# Patient Record
Sex: Male | Born: 2000
Health system: Southern US, Community
[De-identification: ages and names within clinical notes are randomized; demographics above are authoritative.]

## PROBLEM LIST (undated history)

## (undated) DIAGNOSIS — J302 Other seasonal allergic rhinitis: Secondary | ICD-10-CM

---
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
## 2020-01-30 DIAGNOSIS — S83281A Other tear of lateral meniscus, current injury, right knee, initial encounter: Secondary | ICD-10-CM | POA: Diagnosis not present

## 2020-02-04 DIAGNOSIS — Z20828 Contact with and (suspected) exposure to other viral communicable diseases: Secondary | ICD-10-CM | POA: Diagnosis not present

## 2020-02-04 DIAGNOSIS — Z1159 Encounter for screening for other viral diseases: Secondary | ICD-10-CM | POA: Diagnosis not present

## 2020-02-17 DIAGNOSIS — Z20828 Contact with and (suspected) exposure to other viral communicable diseases: Secondary | ICD-10-CM | POA: Diagnosis not present

## 2020-02-17 DIAGNOSIS — Z1159 Encounter for screening for other viral diseases: Secondary | ICD-10-CM | POA: Diagnosis not present

## 2020-02-23 DIAGNOSIS — Z20828 Contact with and (suspected) exposure to other viral communicable diseases: Secondary | ICD-10-CM | POA: Diagnosis not present

## 2020-02-23 DIAGNOSIS — Z1159 Encounter for screening for other viral diseases: Secondary | ICD-10-CM | POA: Diagnosis not present

## 2020-02-24 DIAGNOSIS — S83251A Bucket-handle tear of lateral meniscus, current injury, right knee, initial encounter: Secondary | ICD-10-CM | POA: Diagnosis not present

## 2020-03-01 DIAGNOSIS — Z1159 Encounter for screening for other viral diseases: Secondary | ICD-10-CM | POA: Diagnosis not present

## 2020-03-01 DIAGNOSIS — Z20828 Contact with and (suspected) exposure to other viral communicable diseases: Secondary | ICD-10-CM | POA: Diagnosis not present

## 2020-03-08 DIAGNOSIS — Z1159 Encounter for screening for other viral diseases: Secondary | ICD-10-CM | POA: Diagnosis not present

## 2020-03-08 DIAGNOSIS — Z20828 Contact with and (suspected) exposure to other viral communicable diseases: Secondary | ICD-10-CM | POA: Diagnosis not present

## 2020-03-15 DIAGNOSIS — Z20828 Contact with and (suspected) exposure to other viral communicable diseases: Secondary | ICD-10-CM | POA: Diagnosis not present

## 2020-03-15 DIAGNOSIS — Z1159 Encounter for screening for other viral diseases: Secondary | ICD-10-CM | POA: Diagnosis not present

## 2020-03-15 DIAGNOSIS — S83251D Bucket-handle tear of lateral meniscus, current injury, right knee, subsequent encounter: Secondary | ICD-10-CM | POA: Diagnosis not present

## 2020-03-22 DIAGNOSIS — S83251D Bucket-handle tear of lateral meniscus, current injury, right knee, subsequent encounter: Secondary | ICD-10-CM | POA: Diagnosis not present

## 2020-03-22 DIAGNOSIS — Z20828 Contact with and (suspected) exposure to other viral communicable diseases: Secondary | ICD-10-CM | POA: Diagnosis not present

## 2020-03-22 DIAGNOSIS — Z1159 Encounter for screening for other viral diseases: Secondary | ICD-10-CM | POA: Diagnosis not present

## 2020-03-23 DIAGNOSIS — J029 Acute pharyngitis, unspecified: Secondary | ICD-10-CM | POA: Diagnosis not present

## 2020-03-23 DIAGNOSIS — R059 Cough, unspecified: Secondary | ICD-10-CM | POA: Diagnosis not present

## 2020-03-29 DIAGNOSIS — Z1159 Encounter for screening for other viral diseases: Secondary | ICD-10-CM | POA: Diagnosis not present

## 2020-03-29 DIAGNOSIS — Z20828 Contact with and (suspected) exposure to other viral communicable diseases: Secondary | ICD-10-CM | POA: Diagnosis not present

## 2020-04-05 DIAGNOSIS — Z20828 Contact with and (suspected) exposure to other viral communicable diseases: Secondary | ICD-10-CM | POA: Diagnosis not present

## 2020-04-05 DIAGNOSIS — Z1159 Encounter for screening for other viral diseases: Secondary | ICD-10-CM | POA: Diagnosis not present

## 2020-04-12 DIAGNOSIS — Z1159 Encounter for screening for other viral diseases: Secondary | ICD-10-CM | POA: Diagnosis not present

## 2020-04-12 DIAGNOSIS — Z20828 Contact with and (suspected) exposure to other viral communicable diseases: Secondary | ICD-10-CM | POA: Diagnosis not present

## 2020-04-13 DIAGNOSIS — S83251D Bucket-handle tear of lateral meniscus, current injury, right knee, subsequent encounter: Secondary | ICD-10-CM | POA: Diagnosis not present

## 2020-04-14 DIAGNOSIS — S83251D Bucket-handle tear of lateral meniscus, current injury, right knee, subsequent encounter: Secondary | ICD-10-CM | POA: Diagnosis not present

## 2020-04-14 DIAGNOSIS — S8991XA Unspecified injury of right lower leg, initial encounter: Secondary | ICD-10-CM | POA: Diagnosis not present

## 2020-04-14 DIAGNOSIS — M25561 Pain in right knee: Secondary | ICD-10-CM | POA: Diagnosis not present

## 2020-04-15 DIAGNOSIS — S83251D Bucket-handle tear of lateral meniscus, current injury, right knee, subsequent encounter: Secondary | ICD-10-CM | POA: Diagnosis not present

## 2020-04-16 DIAGNOSIS — S83511A Sprain of anterior cruciate ligament of right knee, initial encounter: Secondary | ICD-10-CM | POA: Diagnosis not present

## 2020-04-16 DIAGNOSIS — S83251D Bucket-handle tear of lateral meniscus, current injury, right knee, subsequent encounter: Secondary | ICD-10-CM | POA: Diagnosis not present

## 2020-04-19 DIAGNOSIS — S83511D Sprain of anterior cruciate ligament of right knee, subsequent encounter: Secondary | ICD-10-CM | POA: Diagnosis not present

## 2020-04-19 DIAGNOSIS — Z1159 Encounter for screening for other viral diseases: Secondary | ICD-10-CM | POA: Diagnosis not present

## 2020-04-19 DIAGNOSIS — S83251D Bucket-handle tear of lateral meniscus, current injury, right knee, subsequent encounter: Secondary | ICD-10-CM | POA: Diagnosis not present

## 2020-04-19 DIAGNOSIS — Z20828 Contact with and (suspected) exposure to other viral communicable diseases: Secondary | ICD-10-CM | POA: Diagnosis not present

## 2020-04-20 DIAGNOSIS — S83251D Bucket-handle tear of lateral meniscus, current injury, right knee, subsequent encounter: Secondary | ICD-10-CM | POA: Diagnosis not present

## 2020-04-21 DIAGNOSIS — S83251D Bucket-handle tear of lateral meniscus, current injury, right knee, subsequent encounter: Secondary | ICD-10-CM | POA: Diagnosis not present

## 2020-04-23 DIAGNOSIS — S83241A Other tear of medial meniscus, current injury, right knee, initial encounter: Secondary | ICD-10-CM | POA: Diagnosis not present

## 2020-04-23 DIAGNOSIS — S83511A Sprain of anterior cruciate ligament of right knee, initial encounter: Secondary | ICD-10-CM | POA: Diagnosis not present

## 2020-04-26 DIAGNOSIS — S83251D Bucket-handle tear of lateral meniscus, current injury, right knee, subsequent encounter: Secondary | ICD-10-CM | POA: Diagnosis not present

## 2020-04-27 DIAGNOSIS — S83251D Bucket-handle tear of lateral meniscus, current injury, right knee, subsequent encounter: Secondary | ICD-10-CM | POA: Diagnosis not present

## 2020-04-28 DIAGNOSIS — S83251D Bucket-handle tear of lateral meniscus, current injury, right knee, subsequent encounter: Secondary | ICD-10-CM | POA: Diagnosis not present

## 2020-05-18 DIAGNOSIS — S83231A Complex tear of medial meniscus, current injury, right knee, initial encounter: Secondary | ICD-10-CM | POA: Diagnosis not present

## 2020-05-18 DIAGNOSIS — S83281A Other tear of lateral meniscus, current injury, right knee, initial encounter: Secondary | ICD-10-CM | POA: Diagnosis not present

## 2020-05-18 DIAGNOSIS — S83511A Sprain of anterior cruciate ligament of right knee, initial encounter: Secondary | ICD-10-CM | POA: Diagnosis not present

## 2020-05-18 DIAGNOSIS — Y999 Unspecified external cause status: Secondary | ICD-10-CM | POA: Diagnosis not present

## 2020-05-18 DIAGNOSIS — G8918 Other acute postprocedural pain: Secondary | ICD-10-CM | POA: Diagnosis not present

## 2020-05-18 DIAGNOSIS — X58XXXA Exposure to other specified factors, initial encounter: Secondary | ICD-10-CM | POA: Diagnosis not present

## 2020-05-21 DIAGNOSIS — M25561 Pain in right knee: Secondary | ICD-10-CM | POA: Diagnosis not present

## 2020-05-24 DIAGNOSIS — M25561 Pain in right knee: Secondary | ICD-10-CM | POA: Diagnosis not present

## 2020-05-25 DIAGNOSIS — M25561 Pain in right knee: Secondary | ICD-10-CM | POA: Diagnosis not present

## 2020-05-27 DIAGNOSIS — M25561 Pain in right knee: Secondary | ICD-10-CM | POA: Diagnosis not present

## 2020-05-31 DIAGNOSIS — Z4789 Encounter for other orthopedic aftercare: Secondary | ICD-10-CM | POA: Diagnosis not present

## 2020-05-31 DIAGNOSIS — M25561 Pain in right knee: Secondary | ICD-10-CM | POA: Diagnosis not present

## 2020-06-02 DIAGNOSIS — M25561 Pain in right knee: Secondary | ICD-10-CM | POA: Diagnosis not present

## 2020-06-03 DIAGNOSIS — M25561 Pain in right knee: Secondary | ICD-10-CM | POA: Diagnosis not present

## 2020-06-10 DIAGNOSIS — M25561 Pain in right knee: Secondary | ICD-10-CM | POA: Diagnosis not present

## 2020-06-14 DIAGNOSIS — M25561 Pain in right knee: Secondary | ICD-10-CM | POA: Diagnosis not present

## 2020-06-17 DIAGNOSIS — M25561 Pain in right knee: Secondary | ICD-10-CM | POA: Diagnosis not present

## 2020-06-21 DIAGNOSIS — M25561 Pain in right knee: Secondary | ICD-10-CM | POA: Diagnosis not present

## 2020-06-21 DIAGNOSIS — Z4789 Encounter for other orthopedic aftercare: Secondary | ICD-10-CM | POA: Diagnosis not present

## 2020-06-25 ENCOUNTER — Other Ambulatory Visit: Payer: BC Managed Care – PPO

## 2020-06-25 DIAGNOSIS — Z20822 Contact with and (suspected) exposure to covid-19: Secondary | ICD-10-CM | POA: Diagnosis not present

## 2020-06-28 DIAGNOSIS — Z1159 Encounter for screening for other viral diseases: Secondary | ICD-10-CM | POA: Diagnosis not present

## 2020-06-28 DIAGNOSIS — Z20828 Contact with and (suspected) exposure to other viral communicable diseases: Secondary | ICD-10-CM | POA: Diagnosis not present

## 2020-06-28 LAB — NOVEL CORONAVIRUS, NAA: SARS-CoV-2, NAA: NOT DETECTED

## 2020-06-29 DIAGNOSIS — S83511D Sprain of anterior cruciate ligament of right knee, subsequent encounter: Secondary | ICD-10-CM | POA: Diagnosis not present

## 2020-06-29 DIAGNOSIS — S83251D Bucket-handle tear of lateral meniscus, current injury, right knee, subsequent encounter: Secondary | ICD-10-CM | POA: Diagnosis not present

## 2020-06-30 DIAGNOSIS — S83251D Bucket-handle tear of lateral meniscus, current injury, right knee, subsequent encounter: Secondary | ICD-10-CM | POA: Diagnosis not present

## 2020-06-30 DIAGNOSIS — S83511D Sprain of anterior cruciate ligament of right knee, subsequent encounter: Secondary | ICD-10-CM | POA: Diagnosis not present

## 2020-07-01 DIAGNOSIS — S83511D Sprain of anterior cruciate ligament of right knee, subsequent encounter: Secondary | ICD-10-CM | POA: Diagnosis not present

## 2020-07-01 DIAGNOSIS — S83251D Bucket-handle tear of lateral meniscus, current injury, right knee, subsequent encounter: Secondary | ICD-10-CM | POA: Diagnosis not present

## 2020-07-05 DIAGNOSIS — S83511D Sprain of anterior cruciate ligament of right knee, subsequent encounter: Secondary | ICD-10-CM | POA: Diagnosis not present

## 2020-07-05 DIAGNOSIS — S83251D Bucket-handle tear of lateral meniscus, current injury, right knee, subsequent encounter: Secondary | ICD-10-CM | POA: Diagnosis not present

## 2020-07-06 DIAGNOSIS — S83511D Sprain of anterior cruciate ligament of right knee, subsequent encounter: Secondary | ICD-10-CM | POA: Diagnosis not present

## 2020-07-06 DIAGNOSIS — S83251D Bucket-handle tear of lateral meniscus, current injury, right knee, subsequent encounter: Secondary | ICD-10-CM | POA: Diagnosis not present

## 2020-07-07 DIAGNOSIS — S83251D Bucket-handle tear of lateral meniscus, current injury, right knee, subsequent encounter: Secondary | ICD-10-CM | POA: Diagnosis not present

## 2020-07-08 DIAGNOSIS — S83511D Sprain of anterior cruciate ligament of right knee, subsequent encounter: Secondary | ICD-10-CM | POA: Diagnosis not present

## 2020-07-08 DIAGNOSIS — S83251D Bucket-handle tear of lateral meniscus, current injury, right knee, subsequent encounter: Secondary | ICD-10-CM | POA: Diagnosis not present

## 2020-07-09 DIAGNOSIS — S83251D Bucket-handle tear of lateral meniscus, current injury, right knee, subsequent encounter: Secondary | ICD-10-CM | POA: Diagnosis not present

## 2020-07-09 DIAGNOSIS — S83511D Sprain of anterior cruciate ligament of right knee, subsequent encounter: Secondary | ICD-10-CM | POA: Diagnosis not present

## 2020-07-12 DIAGNOSIS — S83511D Sprain of anterior cruciate ligament of right knee, subsequent encounter: Secondary | ICD-10-CM | POA: Diagnosis not present

## 2020-07-12 DIAGNOSIS — S83251D Bucket-handle tear of lateral meniscus, current injury, right knee, subsequent encounter: Secondary | ICD-10-CM | POA: Diagnosis not present

## 2020-07-13 DIAGNOSIS — S83511D Sprain of anterior cruciate ligament of right knee, subsequent encounter: Secondary | ICD-10-CM | POA: Diagnosis not present

## 2020-07-13 DIAGNOSIS — S83251D Bucket-handle tear of lateral meniscus, current injury, right knee, subsequent encounter: Secondary | ICD-10-CM | POA: Diagnosis not present

## 2020-07-14 DIAGNOSIS — S83511D Sprain of anterior cruciate ligament of right knee, subsequent encounter: Secondary | ICD-10-CM | POA: Diagnosis not present

## 2020-07-14 DIAGNOSIS — S83251D Bucket-handle tear of lateral meniscus, current injury, right knee, subsequent encounter: Secondary | ICD-10-CM | POA: Diagnosis not present

## 2020-07-15 DIAGNOSIS — S83511D Sprain of anterior cruciate ligament of right knee, subsequent encounter: Secondary | ICD-10-CM | POA: Diagnosis not present

## 2020-07-15 DIAGNOSIS — S83251D Bucket-handle tear of lateral meniscus, current injury, right knee, subsequent encounter: Secondary | ICD-10-CM | POA: Diagnosis not present

## 2020-07-19 DIAGNOSIS — S83251D Bucket-handle tear of lateral meniscus, current injury, right knee, subsequent encounter: Secondary | ICD-10-CM | POA: Diagnosis not present

## 2020-07-19 DIAGNOSIS — S83511D Sprain of anterior cruciate ligament of right knee, subsequent encounter: Secondary | ICD-10-CM | POA: Diagnosis not present

## 2020-07-20 DIAGNOSIS — S83251D Bucket-handle tear of lateral meniscus, current injury, right knee, subsequent encounter: Secondary | ICD-10-CM | POA: Diagnosis not present

## 2020-07-20 DIAGNOSIS — S83511D Sprain of anterior cruciate ligament of right knee, subsequent encounter: Secondary | ICD-10-CM | POA: Diagnosis not present

## 2020-07-22 DIAGNOSIS — S83511D Sprain of anterior cruciate ligament of right knee, subsequent encounter: Secondary | ICD-10-CM | POA: Diagnosis not present

## 2020-07-22 DIAGNOSIS — S83251D Bucket-handle tear of lateral meniscus, current injury, right knee, subsequent encounter: Secondary | ICD-10-CM | POA: Diagnosis not present

## 2020-07-23 DIAGNOSIS — S83251D Bucket-handle tear of lateral meniscus, current injury, right knee, subsequent encounter: Secondary | ICD-10-CM | POA: Diagnosis not present

## 2020-07-23 DIAGNOSIS — S83511D Sprain of anterior cruciate ligament of right knee, subsequent encounter: Secondary | ICD-10-CM | POA: Diagnosis not present

## 2020-07-26 DIAGNOSIS — S83251D Bucket-handle tear of lateral meniscus, current injury, right knee, subsequent encounter: Secondary | ICD-10-CM | POA: Diagnosis not present

## 2020-07-26 DIAGNOSIS — S83511D Sprain of anterior cruciate ligament of right knee, subsequent encounter: Secondary | ICD-10-CM | POA: Diagnosis not present

## 2020-07-27 DIAGNOSIS — S83251D Bucket-handle tear of lateral meniscus, current injury, right knee, subsequent encounter: Secondary | ICD-10-CM | POA: Diagnosis not present

## 2020-07-27 DIAGNOSIS — S83511D Sprain of anterior cruciate ligament of right knee, subsequent encounter: Secondary | ICD-10-CM | POA: Diagnosis not present

## 2020-07-28 DIAGNOSIS — S83251D Bucket-handle tear of lateral meniscus, current injury, right knee, subsequent encounter: Secondary | ICD-10-CM | POA: Diagnosis not present

## 2020-07-28 DIAGNOSIS — S83511D Sprain of anterior cruciate ligament of right knee, subsequent encounter: Secondary | ICD-10-CM | POA: Diagnosis not present

## 2020-08-02 DIAGNOSIS — S83251D Bucket-handle tear of lateral meniscus, current injury, right knee, subsequent encounter: Secondary | ICD-10-CM | POA: Diagnosis not present

## 2020-08-03 DIAGNOSIS — S83251D Bucket-handle tear of lateral meniscus, current injury, right knee, subsequent encounter: Secondary | ICD-10-CM | POA: Diagnosis not present

## 2020-08-03 DIAGNOSIS — S83511D Sprain of anterior cruciate ligament of right knee, subsequent encounter: Secondary | ICD-10-CM | POA: Diagnosis not present

## 2020-08-04 DIAGNOSIS — S83251D Bucket-handle tear of lateral meniscus, current injury, right knee, subsequent encounter: Secondary | ICD-10-CM | POA: Diagnosis not present

## 2020-08-05 DIAGNOSIS — S83251D Bucket-handle tear of lateral meniscus, current injury, right knee, subsequent encounter: Secondary | ICD-10-CM | POA: Diagnosis not present

## 2020-08-05 DIAGNOSIS — S83511D Sprain of anterior cruciate ligament of right knee, subsequent encounter: Secondary | ICD-10-CM | POA: Diagnosis not present

## 2020-08-06 DIAGNOSIS — S83511D Sprain of anterior cruciate ligament of right knee, subsequent encounter: Secondary | ICD-10-CM | POA: Diagnosis not present

## 2020-08-06 DIAGNOSIS — S83251D Bucket-handle tear of lateral meniscus, current injury, right knee, subsequent encounter: Secondary | ICD-10-CM | POA: Diagnosis not present

## 2020-08-09 DIAGNOSIS — S83251D Bucket-handle tear of lateral meniscus, current injury, right knee, subsequent encounter: Secondary | ICD-10-CM | POA: Diagnosis not present

## 2020-08-09 DIAGNOSIS — S83511D Sprain of anterior cruciate ligament of right knee, subsequent encounter: Secondary | ICD-10-CM | POA: Diagnosis not present

## 2020-08-10 DIAGNOSIS — S83511D Sprain of anterior cruciate ligament of right knee, subsequent encounter: Secondary | ICD-10-CM | POA: Diagnosis not present

## 2020-08-10 DIAGNOSIS — S83251D Bucket-handle tear of lateral meniscus, current injury, right knee, subsequent encounter: Secondary | ICD-10-CM | POA: Diagnosis not present

## 2020-08-11 DIAGNOSIS — S83251D Bucket-handle tear of lateral meniscus, current injury, right knee, subsequent encounter: Secondary | ICD-10-CM | POA: Diagnosis not present

## 2020-08-11 DIAGNOSIS — S83511D Sprain of anterior cruciate ligament of right knee, subsequent encounter: Secondary | ICD-10-CM | POA: Diagnosis not present

## 2020-08-12 DIAGNOSIS — S83511D Sprain of anterior cruciate ligament of right knee, subsequent encounter: Secondary | ICD-10-CM | POA: Diagnosis not present

## 2020-08-16 DIAGNOSIS — S83511D Sprain of anterior cruciate ligament of right knee, subsequent encounter: Secondary | ICD-10-CM | POA: Diagnosis not present

## 2020-08-17 DIAGNOSIS — S83511D Sprain of anterior cruciate ligament of right knee, subsequent encounter: Secondary | ICD-10-CM | POA: Diagnosis not present

## 2020-08-18 DIAGNOSIS — S83511D Sprain of anterior cruciate ligament of right knee, subsequent encounter: Secondary | ICD-10-CM | POA: Diagnosis not present

## 2020-08-19 DIAGNOSIS — S83511D Sprain of anterior cruciate ligament of right knee, subsequent encounter: Secondary | ICD-10-CM | POA: Diagnosis not present

## 2020-08-30 DIAGNOSIS — S83511D Sprain of anterior cruciate ligament of right knee, subsequent encounter: Secondary | ICD-10-CM | POA: Diagnosis not present

## 2020-08-31 DIAGNOSIS — R0981 Nasal congestion: Secondary | ICD-10-CM | POA: Diagnosis not present

## 2020-08-31 DIAGNOSIS — M791 Myalgia, unspecified site: Secondary | ICD-10-CM | POA: Diagnosis not present

## 2020-08-31 DIAGNOSIS — R509 Fever, unspecified: Secondary | ICD-10-CM | POA: Diagnosis not present

## 2020-08-31 DIAGNOSIS — R070 Pain in throat: Secondary | ICD-10-CM | POA: Diagnosis not present

## 2020-09-01 DIAGNOSIS — S83511D Sprain of anterior cruciate ligament of right knee, subsequent encounter: Secondary | ICD-10-CM | POA: Diagnosis not present

## 2020-09-02 DIAGNOSIS — S83511D Sprain of anterior cruciate ligament of right knee, subsequent encounter: Secondary | ICD-10-CM | POA: Diagnosis not present

## 2020-09-03 DIAGNOSIS — S83511D Sprain of anterior cruciate ligament of right knee, subsequent encounter: Secondary | ICD-10-CM | POA: Diagnosis not present

## 2020-09-05 DIAGNOSIS — H6501 Acute serous otitis media, right ear: Secondary | ICD-10-CM | POA: Diagnosis not present

## 2020-09-06 DIAGNOSIS — S83511D Sprain of anterior cruciate ligament of right knee, subsequent encounter: Secondary | ICD-10-CM | POA: Diagnosis not present

## 2020-09-07 DIAGNOSIS — S83511D Sprain of anterior cruciate ligament of right knee, subsequent encounter: Secondary | ICD-10-CM | POA: Diagnosis not present

## 2020-09-08 DIAGNOSIS — S83511D Sprain of anterior cruciate ligament of right knee, subsequent encounter: Secondary | ICD-10-CM | POA: Diagnosis not present

## 2020-09-09 DIAGNOSIS — S83511D Sprain of anterior cruciate ligament of right knee, subsequent encounter: Secondary | ICD-10-CM | POA: Diagnosis not present

## 2020-09-13 DIAGNOSIS — S83511D Sprain of anterior cruciate ligament of right knee, subsequent encounter: Secondary | ICD-10-CM | POA: Diagnosis not present

## 2020-09-14 DIAGNOSIS — S83511D Sprain of anterior cruciate ligament of right knee, subsequent encounter: Secondary | ICD-10-CM | POA: Diagnosis not present

## 2020-09-16 DIAGNOSIS — S83511D Sprain of anterior cruciate ligament of right knee, subsequent encounter: Secondary | ICD-10-CM | POA: Diagnosis not present

## 2020-09-20 DIAGNOSIS — S83511D Sprain of anterior cruciate ligament of right knee, subsequent encounter: Secondary | ICD-10-CM | POA: Diagnosis not present

## 2020-09-22 DIAGNOSIS — S83511D Sprain of anterior cruciate ligament of right knee, subsequent encounter: Secondary | ICD-10-CM | POA: Diagnosis not present

## 2020-09-27 DIAGNOSIS — S83511D Sprain of anterior cruciate ligament of right knee, subsequent encounter: Secondary | ICD-10-CM | POA: Diagnosis not present

## 2020-09-28 DIAGNOSIS — S83511D Sprain of anterior cruciate ligament of right knee, subsequent encounter: Secondary | ICD-10-CM | POA: Diagnosis not present

## 2020-09-29 DIAGNOSIS — S83511D Sprain of anterior cruciate ligament of right knee, subsequent encounter: Secondary | ICD-10-CM | POA: Diagnosis not present

## 2020-10-04 DIAGNOSIS — S83511D Sprain of anterior cruciate ligament of right knee, subsequent encounter: Secondary | ICD-10-CM | POA: Diagnosis not present

## 2020-10-05 DIAGNOSIS — S83511D Sprain of anterior cruciate ligament of right knee, subsequent encounter: Secondary | ICD-10-CM | POA: Diagnosis not present

## 2020-10-08 DIAGNOSIS — Z0000 Encounter for general adult medical examination without abnormal findings: Secondary | ICD-10-CM | POA: Diagnosis not present

## 2020-10-11 DIAGNOSIS — S83511D Sprain of anterior cruciate ligament of right knee, subsequent encounter: Secondary | ICD-10-CM | POA: Diagnosis not present

## 2020-10-12 DIAGNOSIS — S83511D Sprain of anterior cruciate ligament of right knee, subsequent encounter: Secondary | ICD-10-CM | POA: Diagnosis not present

## 2020-10-14 DIAGNOSIS — S83511D Sprain of anterior cruciate ligament of right knee, subsequent encounter: Secondary | ICD-10-CM | POA: Diagnosis not present

## 2020-10-15 DIAGNOSIS — S83511D Sprain of anterior cruciate ligament of right knee, subsequent encounter: Secondary | ICD-10-CM | POA: Diagnosis not present

## 2020-11-02 DIAGNOSIS — Z23 Encounter for immunization: Secondary | ICD-10-CM | POA: Diagnosis not present

## 2020-11-15 DIAGNOSIS — Z4789 Encounter for other orthopedic aftercare: Secondary | ICD-10-CM | POA: Diagnosis not present

## 2020-11-19 DIAGNOSIS — N50819 Testicular pain, unspecified: Secondary | ICD-10-CM | POA: Diagnosis not present

## 2020-11-19 DIAGNOSIS — N492 Inflammatory disorders of scrotum: Secondary | ICD-10-CM | POA: Diagnosis not present

## 2020-11-25 ENCOUNTER — Other Ambulatory Visit: Payer: Self-pay | Admitting: Family Medicine

## 2020-11-25 DIAGNOSIS — N50819 Testicular pain, unspecified: Secondary | ICD-10-CM

## 2020-12-01 DIAGNOSIS — Z4789 Encounter for other orthopedic aftercare: Secondary | ICD-10-CM | POA: Diagnosis not present

## 2020-12-07 ENCOUNTER — Ambulatory Visit
Admission: RE | Admit: 2020-12-07 | Discharge: 2020-12-07 | Disposition: A | Discharge status: Home / Self Care | Payer: BC Managed Care – PPO | Source: Ambulatory Visit | Attending: Family Medicine | Admitting: Family Medicine

## 2020-12-07 DIAGNOSIS — I861 Scrotal varices: Secondary | ICD-10-CM | POA: Diagnosis not present

## 2020-12-07 DIAGNOSIS — K4090 Unilateral inguinal hernia, without obstruction or gangrene, not specified as recurrent: Secondary | ICD-10-CM | POA: Diagnosis not present

## 2020-12-07 DIAGNOSIS — N50819 Testicular pain, unspecified: Secondary | ICD-10-CM

## 2020-12-08 DIAGNOSIS — M25561 Pain in right knee: Secondary | ICD-10-CM | POA: Diagnosis not present

## 2020-12-15 DIAGNOSIS — H6122 Impacted cerumen, left ear: Secondary | ICD-10-CM | POA: Diagnosis not present

## 2020-12-16 DIAGNOSIS — M25561 Pain in right knee: Secondary | ICD-10-CM | POA: Diagnosis not present

## 2020-12-20 DIAGNOSIS — M25561 Pain in right knee: Secondary | ICD-10-CM | POA: Diagnosis not present

## 2020-12-24 DIAGNOSIS — M25561 Pain in right knee: Secondary | ICD-10-CM | POA: Diagnosis not present

## 2020-12-29 DIAGNOSIS — M25561 Pain in right knee: Secondary | ICD-10-CM | POA: Diagnosis not present

## 2021-01-06 DIAGNOSIS — M25561 Pain in right knee: Secondary | ICD-10-CM | POA: Diagnosis not present

## 2021-01-07 DIAGNOSIS — K4090 Unilateral inguinal hernia, without obstruction or gangrene, not specified as recurrent: Secondary | ICD-10-CM | POA: Diagnosis not present

## 2021-01-07 DIAGNOSIS — L0591 Pilonidal cyst without abscess: Secondary | ICD-10-CM | POA: Diagnosis not present

## 2021-01-11 DIAGNOSIS — M25561 Pain in right knee: Secondary | ICD-10-CM | POA: Diagnosis not present

## 2021-01-21 DIAGNOSIS — M25561 Pain in right knee: Secondary | ICD-10-CM | POA: Diagnosis not present

## 2021-01-28 DIAGNOSIS — M25561 Pain in right knee: Secondary | ICD-10-CM | POA: Diagnosis not present

## 2021-01-31 DIAGNOSIS — M25561 Pain in right knee: Secondary | ICD-10-CM | POA: Diagnosis not present

## 2021-02-09 DIAGNOSIS — M25561 Pain in right knee: Secondary | ICD-10-CM | POA: Diagnosis not present

## 2021-02-10 DIAGNOSIS — K4020 Bilateral inguinal hernia, without obstruction or gangrene, not specified as recurrent: Secondary | ICD-10-CM | POA: Diagnosis not present

## 2021-02-10 DIAGNOSIS — L0591 Pilonidal cyst without abscess: Secondary | ICD-10-CM | POA: Diagnosis not present

## 2021-03-09 DIAGNOSIS — K4020 Bilateral inguinal hernia, without obstruction or gangrene, not specified as recurrent: Secondary | ICD-10-CM | POA: Diagnosis not present

## 2021-03-11 DIAGNOSIS — D176 Benign lipomatous neoplasm of spermatic cord: Secondary | ICD-10-CM | POA: Diagnosis not present

## 2021-03-11 DIAGNOSIS — K4020 Bilateral inguinal hernia, without obstruction or gangrene, not specified as recurrent: Secondary | ICD-10-CM | POA: Diagnosis not present

## 2021-03-11 DIAGNOSIS — N50812 Left testicular pain: Secondary | ICD-10-CM | POA: Diagnosis not present
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
## 2021-04-13 DIAGNOSIS — J029 Acute pharyngitis, unspecified: Secondary | ICD-10-CM | POA: Diagnosis not present

## 2021-04-13 DIAGNOSIS — Z8709 Personal history of other diseases of the respiratory system: Secondary | ICD-10-CM | POA: Diagnosis not present

## 2021-04-29 DIAGNOSIS — R1031 Right lower quadrant pain: Secondary | ICD-10-CM | POA: Diagnosis not present

## 2021-04-29 DIAGNOSIS — G8918 Other acute postprocedural pain: Secondary | ICD-10-CM | POA: Diagnosis not present

## 2021-06-22 DIAGNOSIS — R7309 Other abnormal glucose: Secondary | ICD-10-CM | POA: Diagnosis not present

## 2021-06-22 DIAGNOSIS — R7989 Other specified abnormal findings of blood chemistry: Secondary | ICD-10-CM | POA: Diagnosis not present

## 2021-06-22 DIAGNOSIS — Z0000 Encounter for general adult medical examination without abnormal findings: Secondary | ICD-10-CM | POA: Diagnosis not present

## 2021-07-07 DIAGNOSIS — M25561 Pain in right knee: Secondary | ICD-10-CM | POA: Diagnosis not present

## 2021-07-11 DIAGNOSIS — Z4789 Encounter for other orthopedic aftercare: Secondary | ICD-10-CM | POA: Diagnosis not present

## 2021-07-18 DIAGNOSIS — Z0189 Encounter for other specified special examinations: Secondary | ICD-10-CM | POA: Diagnosis not present

## 2021-12-15 DIAGNOSIS — K529 Noninfective gastroenteritis and colitis, unspecified: Secondary | ICD-10-CM | POA: Diagnosis not present

## 2021-12-15 DIAGNOSIS — Z800 Family history of malignant neoplasm of digestive organs: Secondary | ICD-10-CM | POA: Diagnosis not present

## 2022-01-13 DIAGNOSIS — W1843XA Slipping, tripping and stumbling without falling due to stepping from one level to another, initial encounter: Secondary | ICD-10-CM | POA: Diagnosis not present

## 2022-01-13 DIAGNOSIS — M1711 Unilateral primary osteoarthritis, right knee: Secondary | ICD-10-CM | POA: Diagnosis not present

## 2022-01-13 DIAGNOSIS — M2391 Unspecified internal derangement of right knee: Secondary | ICD-10-CM | POA: Diagnosis not present

## 2022-01-13 DIAGNOSIS — M23221 Derangement of posterior horn of medial meniscus due to old tear or injury, right knee: Secondary | ICD-10-CM | POA: Diagnosis not present

## 2022-01-13 DIAGNOSIS — M25561 Pain in right knee: Secondary | ICD-10-CM | POA: Diagnosis not present

## 2022-05-19 DIAGNOSIS — M899 Disorder of bone, unspecified: Secondary | ICD-10-CM | POA: Diagnosis not present

## 2022-05-19 DIAGNOSIS — S99921A Unspecified injury of right foot, initial encounter: Secondary | ICD-10-CM | POA: Diagnosis not present

## 2022-06-22 DIAGNOSIS — R748 Abnormal levels of other serum enzymes: Secondary | ICD-10-CM | POA: Diagnosis not present

## 2022-06-22 DIAGNOSIS — R7309 Other abnormal glucose: Secondary | ICD-10-CM | POA: Diagnosis not present

## 2022-06-22 DIAGNOSIS — Z23 Encounter for immunization: Secondary | ICD-10-CM | POA: Diagnosis not present

## 2022-06-22 DIAGNOSIS — Z0000 Encounter for general adult medical examination without abnormal findings: Secondary | ICD-10-CM | POA: Diagnosis not present

## 2022-07-04 DIAGNOSIS — M79674 Pain in right toe(s): Secondary | ICD-10-CM | POA: Diagnosis not present

## 2022-07-04 DIAGNOSIS — S99921D Unspecified injury of right foot, subsequent encounter: Secondary | ICD-10-CM | POA: Diagnosis not present

## 2022-07-04 DIAGNOSIS — M899 Disorder of bone, unspecified: Secondary | ICD-10-CM | POA: Diagnosis not present

## 2022-07-05 DIAGNOSIS — X58XXXD Exposure to other specified factors, subsequent encounter: Secondary | ICD-10-CM | POA: Diagnosis not present

## 2022-07-05 DIAGNOSIS — S92311A Displaced fracture of first metatarsal bone, right foot, initial encounter for closed fracture: Secondary | ICD-10-CM | POA: Diagnosis not present

## 2022-07-05 DIAGNOSIS — S99921D Unspecified injury of right foot, subsequent encounter: Secondary | ICD-10-CM | POA: Diagnosis not present

## 2022-07-07 ENCOUNTER — Other Ambulatory Visit: Payer: Self-pay | Admitting: Gastroenterology

## 2022-07-07 ENCOUNTER — Ambulatory Visit
Admission: RE | Admit: 2022-07-07 | Discharge: 2022-07-07 | Disposition: A | Discharge status: Home / Self Care | Payer: BC Managed Care – PPO | Source: Ambulatory Visit | Attending: Gastroenterology | Admitting: Gastroenterology

## 2022-07-07 DIAGNOSIS — R197 Diarrhea, unspecified: Secondary | ICD-10-CM | POA: Diagnosis not present

## 2022-07-07 DIAGNOSIS — R195 Other fecal abnormalities: Secondary | ICD-10-CM | POA: Diagnosis not present

## 2022-07-10 DIAGNOSIS — R195 Other fecal abnormalities: Secondary | ICD-10-CM | POA: Diagnosis not present

## 2022-07-12 DIAGNOSIS — M25871 Other specified joint disorders, right ankle and foot: Secondary | ICD-10-CM | POA: Diagnosis not present

## 2022-07-12 DIAGNOSIS — M6701 Short Achilles tendon (acquired), right ankle: Secondary | ICD-10-CM | POA: Diagnosis not present

## 2022-07-12 DIAGNOSIS — Q6671 Congenital pes cavus, right foot: Secondary | ICD-10-CM | POA: Diagnosis not present

## 2022-07-12 DIAGNOSIS — M6702 Short Achilles tendon (acquired), left ankle: Secondary | ICD-10-CM | POA: Diagnosis not present

## 2022-07-17 DIAGNOSIS — H6121 Impacted cerumen, right ear: Secondary | ICD-10-CM | POA: Diagnosis not present

## 2022-08-07 DIAGNOSIS — R194 Change in bowel habit: Secondary | ICD-10-CM | POA: Diagnosis not present

## 2022-08-07 DIAGNOSIS — K602 Anal fissure, unspecified: Secondary | ICD-10-CM | POA: Diagnosis not present

## 2022-08-07 DIAGNOSIS — K625 Hemorrhage of anus and rectum: Secondary | ICD-10-CM | POA: Diagnosis not present

## 2022-08-07 DIAGNOSIS — R197 Diarrhea, unspecified: Secondary | ICD-10-CM | POA: Diagnosis not present

## 2022-09-06 DIAGNOSIS — S99921A Unspecified injury of right foot, initial encounter: Secondary | ICD-10-CM | POA: Diagnosis not present

## 2022-09-06 DIAGNOSIS — S92811A Other fracture of right foot, initial encounter for closed fracture: Secondary | ICD-10-CM | POA: Diagnosis not present

## 2022-09-11 DIAGNOSIS — M25871 Other specified joint disorders, right ankle and foot: Secondary | ICD-10-CM | POA: Diagnosis not present

## 2022-12-20 DIAGNOSIS — R509 Fever, unspecified: Secondary | ICD-10-CM | POA: Diagnosis not present

## 2023-04-21 IMAGING — US US SCROTUM W/ DOPPLER COMPLETE
1 series · 13 of 25 positions shown · non-contrast
Comparison: None.

CLINICAL DATA: Testicular discomfort. Small palpable nodule along
the superior left testicle

EXAM:
SCROTAL ULTRASOUND
DOPPLER ULTRASOUND OF THE TESTICLES
TECHNIQUE: Complete ultrasound examination of the testicles, epididymis, and
other scrotal structures was performed. Color and spectral Doppler
ultrasound were also utilized to evaluate blood flow to the
testicles.

[Series 1: us scrotum w/ doppler complete · 0.07mm/px · 41 acquisitions, 13 frames shown]
[im 1/41]
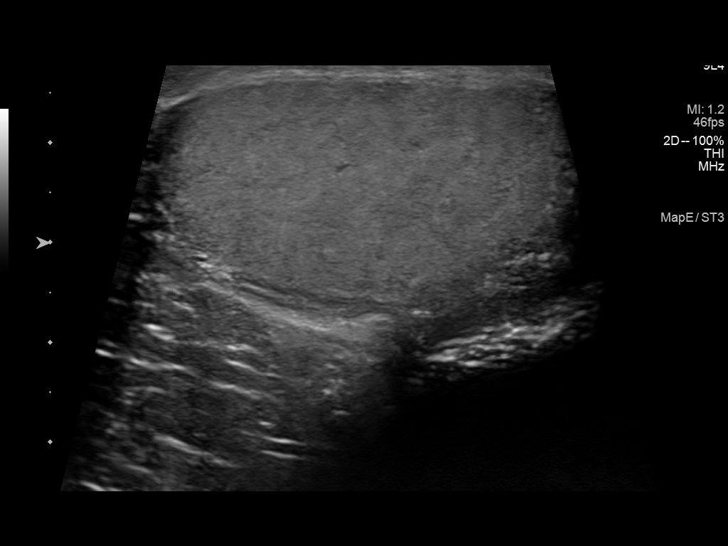
[im 4/41]
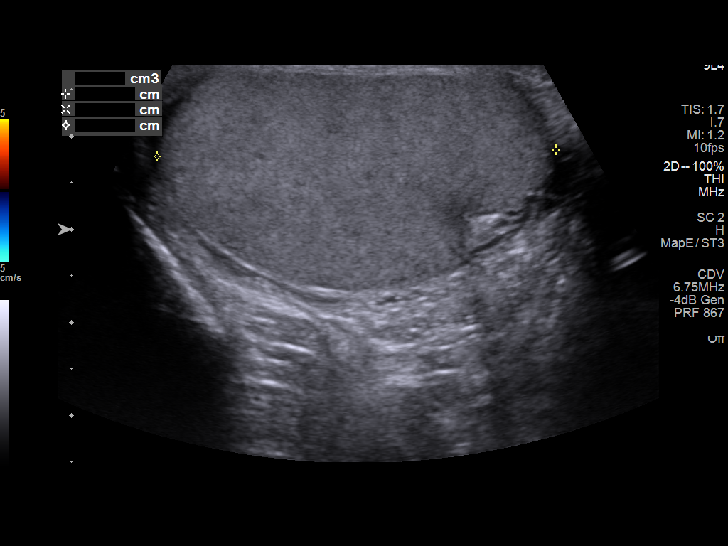
[im 7/41]
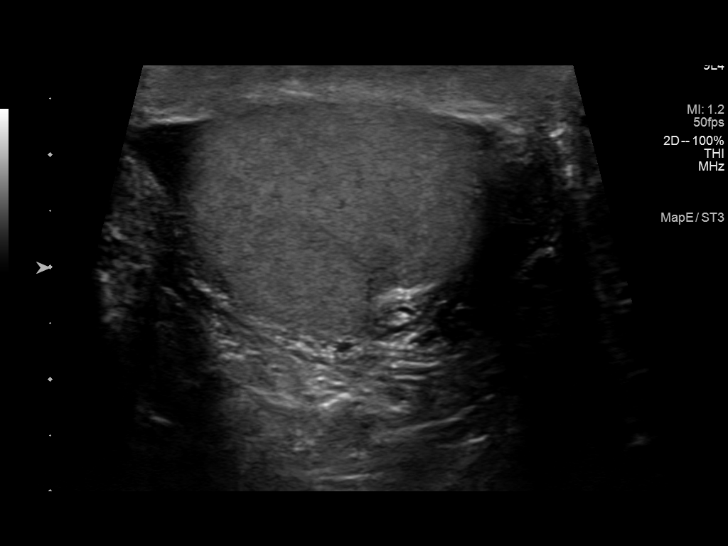
[im 11/41]
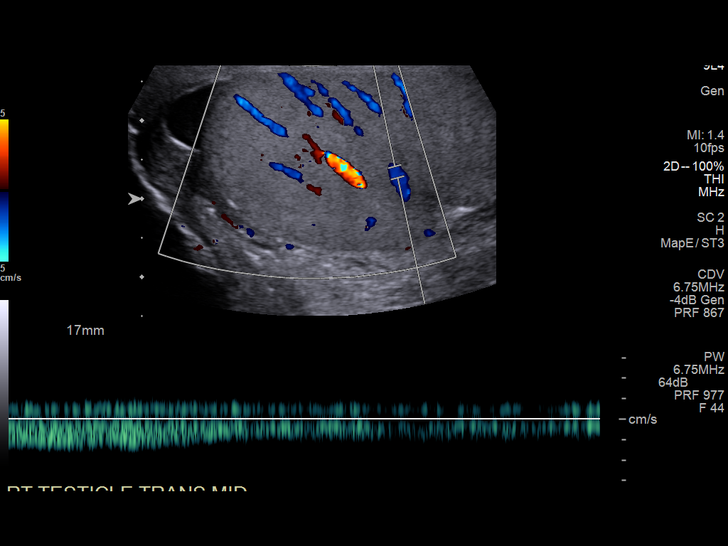
[im 14/41]
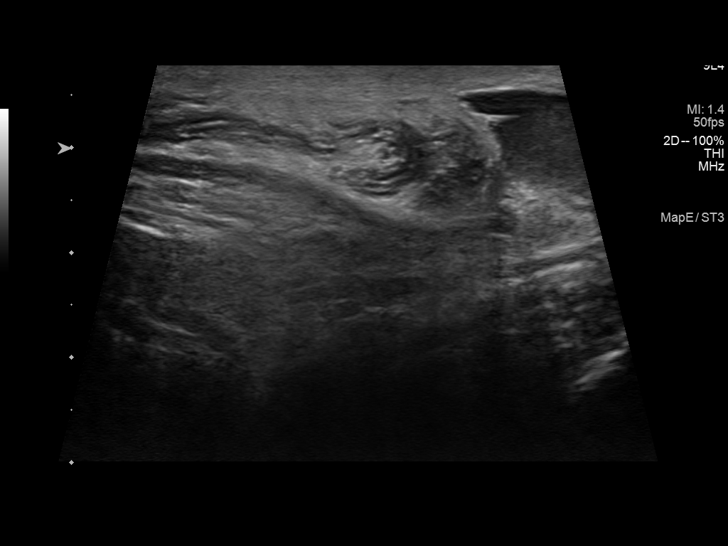
[im 17/41]
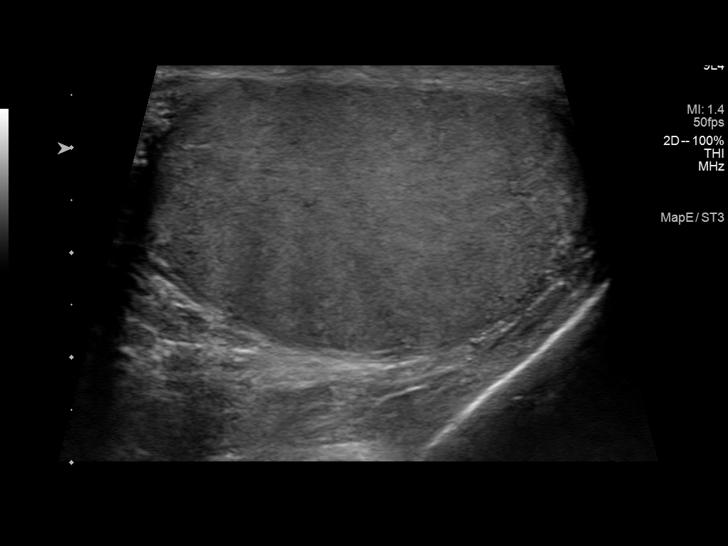
[im 21/41]
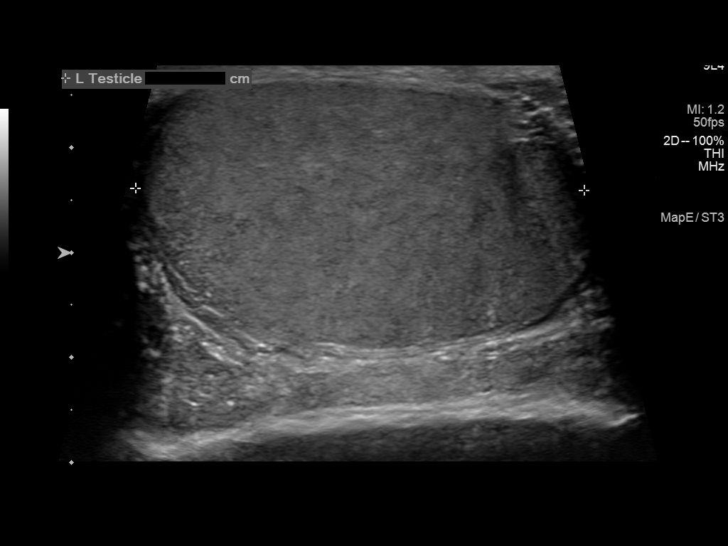
[im 24/41]
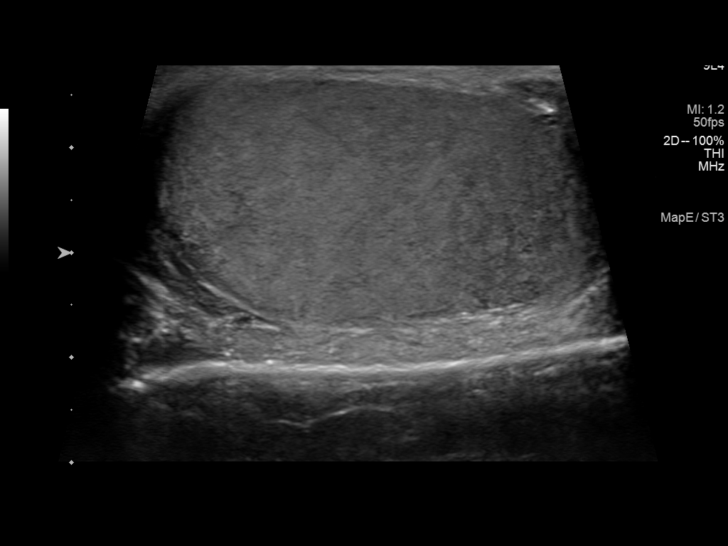
[im 27/41]
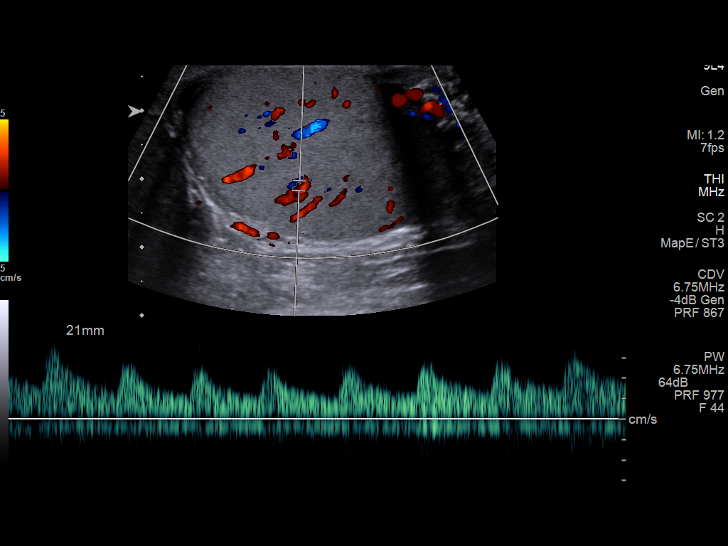
[im 31/41]
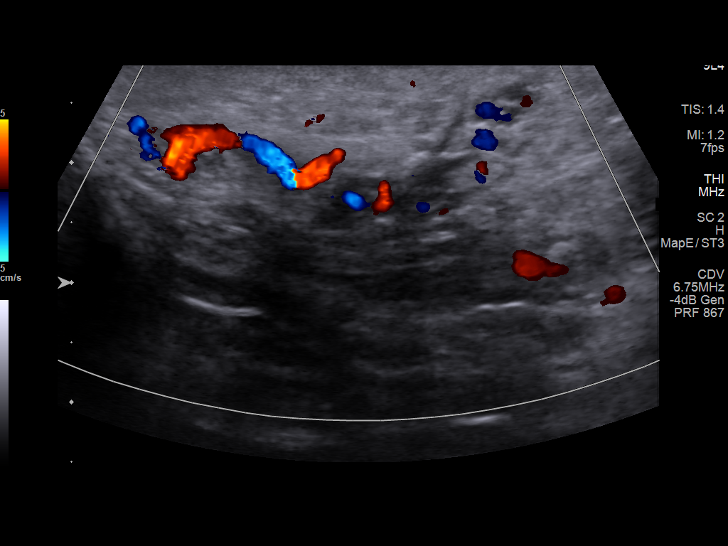
[im 34/41]
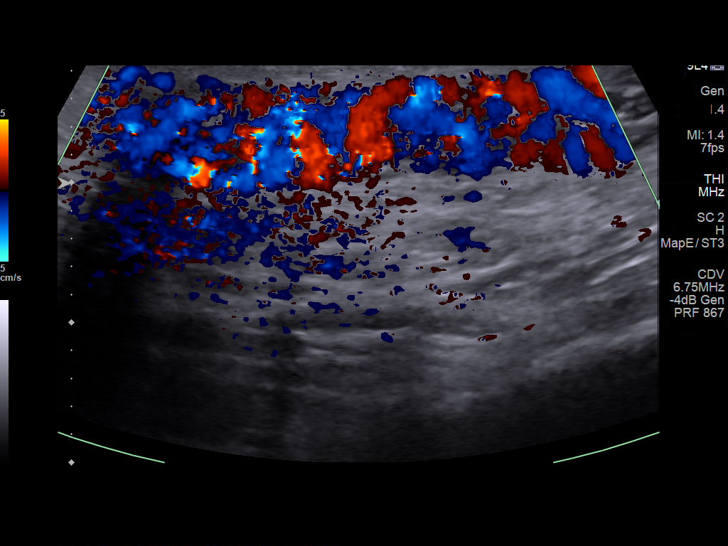
[im 37/41]
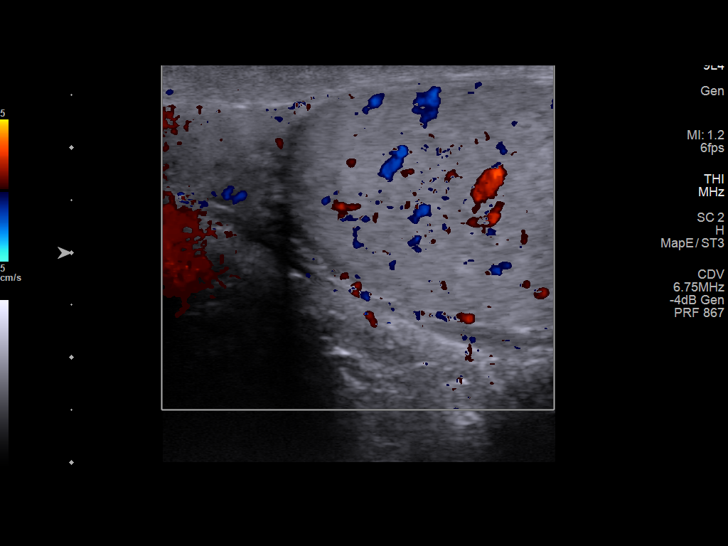
[im 41/41]
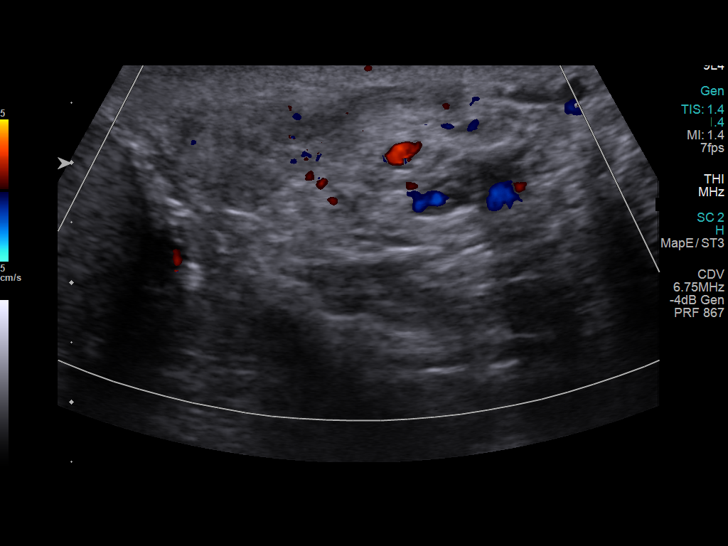

[13 of 25 positions shown; findings below may reference images not displayed]

FINDINGS: Right testicle

Measurements: 5.4 x 2.4 x 4.3 cm. No mass or microlithiasis
visualized.

Left testicle

Measurements: 4.5 x 2.6 x 4.3 cm. No mass or microlithiasis
visualized.

Right epididymis:  Normal in size and appearance.

Left epididymis:  Normal in size and appearance.

Hydrocele:  None visualized.

Varicocele: Asymmetric borderline dilatation of the left spermatic
cord draining vessels with the measuring between 2-3 mm in diameter.
Normal appearance of the right spermatic cord.

Pulsed Doppler interrogation of both testes demonstrates normal low
resistance arterial and venous waveforms bilaterally.

Additional note made of echogenic fat seen protruding along the left
spermatic cord which appears slightly mobile with provocative
Valsalva maneuver, suggestive of a fat containing inguinal hernia.
No demonstrable bowel signal seen within this protrusion.
IMPRESSION: No concerning testicular mass or evidence of torsion.

Borderline left varicocele.

Additional note made of a small fat containing left inguinal hernia.

## 2023-05-31 DIAGNOSIS — J029 Acute pharyngitis, unspecified: Secondary | ICD-10-CM | POA: Diagnosis not present

## 2023-06-01 DIAGNOSIS — J029 Acute pharyngitis, unspecified: Secondary | ICD-10-CM | POA: Diagnosis not present

## 2023-06-01 DIAGNOSIS — J358 Other chronic diseases of tonsils and adenoids: Secondary | ICD-10-CM | POA: Diagnosis not present

## 2023-06-18 DIAGNOSIS — R21 Rash and other nonspecific skin eruption: Secondary | ICD-10-CM | POA: Diagnosis not present

## 2023-06-18 DIAGNOSIS — H6122 Impacted cerumen, left ear: Secondary | ICD-10-CM | POA: Diagnosis not present

## 2023-07-04 DIAGNOSIS — J3503 Chronic tonsillitis and adenoiditis: Secondary | ICD-10-CM | POA: Diagnosis not present

## 2023-07-29 DIAGNOSIS — L309 Dermatitis, unspecified: Secondary | ICD-10-CM | POA: Diagnosis not present

## 2023-08-01 DIAGNOSIS — R21 Rash and other nonspecific skin eruption: Secondary | ICD-10-CM | POA: Diagnosis not present

## 2023-08-10 DIAGNOSIS — L249 Irritant contact dermatitis, unspecified cause: Secondary | ICD-10-CM | POA: Diagnosis not present

## 2023-09-05 DIAGNOSIS — R21 Rash and other nonspecific skin eruption: Secondary | ICD-10-CM | POA: Diagnosis not present

## 2023-09-05 DIAGNOSIS — J3089 Other allergic rhinitis: Secondary | ICD-10-CM | POA: Diagnosis not present

## 2023-09-05 DIAGNOSIS — R052 Subacute cough: Secondary | ICD-10-CM | POA: Diagnosis not present

## 2023-09-05 DIAGNOSIS — H1045 Other chronic allergic conjunctivitis: Secondary | ICD-10-CM | POA: Diagnosis not present

## 2023-09-27 DIAGNOSIS — W57XXXA Bitten or stung by nonvenomous insect and other nonvenomous arthropods, initial encounter: Secondary | ICD-10-CM | POA: Diagnosis not present

## 2023-09-27 DIAGNOSIS — S30863A Insect bite (nonvenomous) of scrotum and testes, initial encounter: Secondary | ICD-10-CM | POA: Diagnosis not present

## 2023-11-15 DIAGNOSIS — Z111 Encounter for screening for respiratory tuberculosis: Secondary | ICD-10-CM | POA: Diagnosis not present

## 2023-11-17 DIAGNOSIS — Z111 Encounter for screening for respiratory tuberculosis: Secondary | ICD-10-CM | POA: Diagnosis not present

## 2023-11-23 DIAGNOSIS — Z23 Encounter for immunization: Secondary | ICD-10-CM | POA: Diagnosis not present

## 2023-12-16 ENCOUNTER — Emergency Department (HOSPITAL_COMMUNITY)
Admission: EM | Admit: 2023-12-16 | Discharge: 2023-12-16 | Disposition: A | Discharge status: Home / Self Care | Attending: Emergency Medicine | Admitting: Emergency Medicine

## 2023-12-16 ENCOUNTER — Encounter (HOSPITAL_COMMUNITY): Payer: Self-pay | Admitting: *Deleted

## 2023-12-16 ENCOUNTER — Other Ambulatory Visit: Payer: Self-pay

## 2023-12-16 DIAGNOSIS — Z23 Encounter for immunization: Secondary | ICD-10-CM | POA: Insufficient documentation

## 2023-12-16 DIAGNOSIS — J111 Influenza due to unidentified influenza virus with other respiratory manifestations: Secondary | ICD-10-CM | POA: Diagnosis not present

## 2023-12-16 NOTE — Discharge Instructions (Signed)
 You are seen in the ER with the request of getting a flu shot.  Unfortunately, there are no flu shots available in the ER and we have reached out to our outpatient colleagues and even called [facility] 24[facility]hour pharmacies.  None of these pharmacies have any flu shot available.  I was instructed by pharmacist that flu shots expire in June.  The new batch of flu shots will come in August.  In this case, we request that you discuss the circumstances with the hospital credentialing committee.  Perhaps they can give you exemption until the August flu shots are released and you will have to do symptom check and wear mask every time you are in the hospital setting.

## 2023-12-16 NOTE — ED Notes (Signed)
 Pt approached desk and asked this paramedic about the bill he received for today's visit. Pt was upset that he will be charged for visiting the emergency department when he was unable to receive the flu shot he came in for. Pt's nurse notified of conversation.

## 2023-12-16 NOTE — ED Notes (Addendum)
 Pt no longer at bedside. Pt suspected to have eloped prior to d/c.

## 2023-12-16 NOTE — ED Triage Notes (Signed)
 The pt reports that he needs a flu shot for a new job

## 2023-12-16 NOTE — ED Provider Notes (Signed)
 [facility]GENCY DEPARTMENT AT [facility] Provider Note   CSN: [id number] Arrival date & time: 12/16/23  1542     Patient presents with: needs a flu shot   Jonathan Avila is a 23 y.o. male.   HPI    23 year old male comes in with chief complaint of flu shot. 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  He has checked with many pharmacies, they do not have a flu shot.  Therefore he came to the ER to see if he can help.  Prior to Admission medications   Medication Sig Start Date End Date Taking? Authorizing Provider  acetaminophen  (TYLENOL ) 325 MG tablet Take 650 mg by mouth every 6 (six) hours as needed for pain.    [provider]  cephALEXin  (KEFLEX ) 500 MG capsule Take 500 mg by mouth 3 (three) times daily.    [provider]  cephALEXin  (KEFLEX ) 500 MG capsule Take 1 capsule (500 mg total) by mouth 3 (three) times daily. 11/22/18   Patt Alm Macho, MD  HYDROcodone -acetaminophen  (NORCO/VICODIN) 5-325 MG tablet Take 1 tablet by mouth every 6 (six) hours as needed for moderate pain.    [provider]  HYDROcodone -acetaminophen  (NORCO/VICODIN) 5-325 MG tablet Take 1 tablet by mouth every 6 (six) hours as needed. 11/22/18   Patt Alm Macho, MD    Allergies: Patient has no known allergies.    Review of Systems  All other systems reviewed and are negative.   Updated Vital Signs BP (!) 146/74 (BP Location: Right Arm)   Pulse (!) 52   Temp 98.4 F (36.9 C)   Resp 17   Ht 6' 3 (1.905 m)   Wt 117 kg   SpO2 99%   BMI 32.24 kg/m   Physical Exam Vitals and nursing note reviewed.  Constitutional:      Appearance: He is well-developed.  Cardiovascular:     Rate and Rhythm: Normal rate.  Pulmonary:     Effort: Pulmonary effort is normal.  Musculoskeletal:     Cervical back: Neck  supple.  Neurological:     Mental Status: He is alert and oriented to person, place, and time.     (all labs ordered are listed, but only abnormal results are displayed) Labs Reviewed - No data to display  EKG: None  Radiology: No results found.   Procedures   Medications Ordered in the ED - No data to display                                  Medical Decision Making  23 year old male comes in with chief complaint of needing a flu shot. Patient has no complaints and is currently asymptomatic.  It seems like this is a work requirement.  I consulted our pharmacy team and was instructed that the flu vaccine expires in June.  Patient therefore will not be able to get flu shot in the hospital.  They doubt that our outpatient pharmacies have any flu shots either.  The next batch likely will come in August.  Patient made aware of the unfortunate situation.  I have put in his discharge paperwork that the current flu shots have already expired, and that perhaps he get an exemption this year,  with conditions such as wearing mask when hospital premises.  Final diagnoses:  Flu vaccine need    ED Discharge Orders     None          Charlyn Sora, MD 12/16/23 1742

## 2024-04-11 DIAGNOSIS — F411 Generalized anxiety disorder: Secondary | ICD-10-CM | POA: Diagnosis not present

## 2024-05-07 DIAGNOSIS — B9689 Other specified bacterial agents as the cause of diseases classified elsewhere: Secondary | ICD-10-CM | POA: Diagnosis not present

## 2024-05-07 DIAGNOSIS — J069 Acute upper respiratory infection, unspecified: Secondary | ICD-10-CM | POA: Diagnosis not present

## 2024-05-11 DIAGNOSIS — F411 Generalized anxiety disorder: Secondary | ICD-10-CM | POA: Diagnosis not present

## 2024-05-19 DIAGNOSIS — J020 Streptococcal pharyngitis: Secondary | ICD-10-CM | POA: Diagnosis not present
